# Patient Record
Sex: Male | Born: 1980 | Race: White | Hispanic: No | Marital: Single | Smoking: Never smoker
Health system: Southern US, Community
[De-identification: ages and names within clinical notes are randomized; demographics above are authoritative.]

## PROBLEM LIST (undated history)

## (undated) DIAGNOSIS — E785 Hyperlipidemia, unspecified: Secondary | ICD-10-CM

## (undated) DIAGNOSIS — F32A Depression, unspecified: Secondary | ICD-10-CM

## (undated) HISTORY — DX: Hyperlipidemia, unspecified: E78.5

## (undated) HISTORY — PX: NO PAST SURGERIES: SHX2092

## (undated) HISTORY — DX: Depression, unspecified: F32.A

## (undated) HISTORY — PX: UPPER GASTROINTESTINAL ENDOSCOPY: SHX188

---

## 2009-11-04 DIAGNOSIS — F325 Major depressive disorder, single episode, in full remission: Secondary | ICD-10-CM | POA: Insufficient documentation

## 2020-01-25 DIAGNOSIS — N201 Calculus of ureter: Secondary | ICD-10-CM | POA: Insufficient documentation

## 2021-09-24 ENCOUNTER — Other Ambulatory Visit: Payer: Self-pay | Admitting: Family Medicine

## 2021-09-24 ENCOUNTER — Ambulatory Visit
Admission: RE | Admit: 2021-09-24 | Discharge: 2021-09-24 | Disposition: A | Discharge status: Home / Self Care | Payer: Self-pay | Source: Ambulatory Visit | Attending: Family Medicine | Admitting: Family Medicine

## 2021-09-24 ENCOUNTER — Other Ambulatory Visit: Payer: Self-pay

## 2021-09-24 DIAGNOSIS — R079 Chest pain, unspecified: Secondary | ICD-10-CM

## 2021-11-15 DIAGNOSIS — E7849 Other hyperlipidemia: Secondary | ICD-10-CM | POA: Insufficient documentation

## 2021-11-15 DIAGNOSIS — R0789 Other chest pain: Secondary | ICD-10-CM | POA: Insufficient documentation

## 2022-01-15 ENCOUNTER — Encounter: Payer: Self-pay | Admitting: Physician Assistant

## 2022-01-24 ENCOUNTER — Ambulatory Visit: Payer: BC Managed Care – PPO | Admitting: Physician Assistant

## 2022-01-24 ENCOUNTER — Other Ambulatory Visit: Payer: Self-pay

## 2022-01-24 ENCOUNTER — Encounter: Payer: Self-pay | Admitting: Physician Assistant

## 2022-01-24 VITALS — BP 102/70 | HR 74 | Ht 71.0 in | Wt 161.0 lb

## 2022-01-24 DIAGNOSIS — R0789 Other chest pain: Secondary | ICD-10-CM

## 2022-01-24 NOTE — Patient Instructions (Signed)
You will be contacted by [facility] in the next 2 days to arrange a Barium Swallow and Upper GI  The number on your caller ID will be [phone], please answer when they call.  If you have not heard from them in 2 days please call [phone] to schedule.    ? ? ?You have been scheduled for a Barium Esophogram at [facility] Radiology (1st floor of the hospital) on ________ at ________. Please arrive 15 minutes prior to your appointment for registration. Make certain not to have anything to eat or drink 3 hours prior to your test. If you need to reschedule for any reason, please contact radiology at [phone] to do so. ?__________________________________________________________________ ?A barium swallow is an examination that concentrates on views of the esophagus. This tends to be a double contrast exam (barium and two liquids which, when combined, create a gas to distend the wall of the oesophagus) or single contrast (non-ionic iodine based). The study is usually tailored to your symptoms so a good history is essential. Attention is paid during the study to the form, structure and configuration of the esophagus, looking for functional disorders (such as aspiration, dysphagia, achalasia, motility and reflux) ?EXAMINATION ?You may be asked to change into a gown, depending on the type of swallow being performed. A radiologist and radiographer will perform the procedure. The radiologist will advise you of the ?type of contrast selected for your procedure and direct you during the exam. You will be asked to stand, sit or lie in several different positions and to hold a small amount of fluid in your mouth before being asked to swallow while the imaging is performed .In some instances you may be asked to swallow barium coated marshmallows to assess the motility of a solid food bolus. ?The exam can be recorded as a digital or video fluoroscopy procedure. ?POST PROCEDURE ?It will take 1-2 days for  the barium to pass through your system. To facilitate this, it is important, unless otherwise directed, to increase your fluids for the next 24-48hrs and to resume your normal diet.  ?This test typically takes about 30 minutes to perform. ?__________________________________________________________________________________  ? ?You have been scheduled for an Upper GI Series at ________. Your appointment is on _______ at ________. Please arrive 15 minutes prior to your test for registration. Make sure not to eat or drink anything after midnight on the night before your test. If you need to reschedule, please call radiology at [phone]. ?________________________________________________________________ ?An upper GI series uses x rays to help diagnose problems of the upper GI tract, which includes the esophagus, stomach, and duodenum. The duodenum is the first part of the small intestine. ?An upper GI series is conducted by a radiology technologist or a radiologist--a doctor who specializes in x-ray imaging--at a hospital or outpatient center. ?While sitting or standing in front of an x-ray machine, the patient drinks barium liquid, which is often white and has a chalky consistency and taste. The barium liquid coats the lining of the upper GI tract and makes signs of disease show up more clearly on x rays. X-ray video, called fluoroscopy, is used to view the barium liquid moving through the esophagus, stomach, and duodenum. ?Additional x rays and fluoroscopy are performed while the patient lies on an x-ray table. To fully coat the upper GI tract with barium liquid, the technologist or radiologist may press on the abdomen or ask the patient to change position. Patients hold still in various positions, allowing the  technologist or radiologist to take x rays of the upper GI tract at different angles. If a technologist conducts the upper GI series, a radiologist will later examine the images to look for problems. ? ?This  test typically takes about 1 hour to complete. ?___________________________________________ ? ? ?Due to recent changes in healthcare laws, you may see the results of your imaging and laboratory studies on MyChart before your provider has had a chance to review them.  We understand that in some cases there may be results that are confusing or concerning to you. Not all laboratory results come back in the same time frame and the provider may be waiting for multiple results in order to interpret others.  Please give [place] 48 hours in order for your provider to thoroughly review all the results before contacting the office for clarification of your results.   ? ?If you are age 49 or older, your body mass index should be between 23-30. Your Body mass index is 22.45 kg/m?Marland Kitchen If this is out of the aforementioned range listed, please consider follow up with your Primary Care Provider. ? ?If you are age 46 or younger, your body mass index should be between 19-25. Your Body mass index is 22.45 kg/m?Marland Kitchen If this is out of the aformentioned range listed, please consider follow up with your Primary Care Provider.  ? ?________________________________________________________ ? ?The [place] [facility] providers would like to encourage you to use [facility] to communicate with providers for non-urgent requests or questions.  Due to long hold times on the telephone, sending your provider a message by [facility] may be a faster and more efficient way to get a response.  Please allow 48 business hours for a response.  Please remember that this is for non-urgent requests.  ?_______________________________________________________  ? ?I appreciate the  opportunity to care for you ? ?Thank You  ? ?Jacelyn Grip  ?

## 2022-01-24 NOTE — Progress Notes (Signed)
? ?Chief Complaint: Atypical chest pain ? ?HPI: ?   George Buchanan is a 41 year old male with a past medical history as listed below, who was referred to me by Shawnee Knapp, MD for a complaint of atypical chest pain.   ?   09/25/2021 chest x-ray with no abnormalities. ?   01/03/2022 patient saw PCP and discussed ongoing chest pain for several months with negative cardiology work-up.  Patient has seen cardiology 11/15/2021 and then did a trial of Imdur but it was not tolerated due to headaches.  They discussed doing CTA.  He endorsed some anxiety at his appointment.  He was trying to get off Fluoxetine at that time.  Since then he had increased dose and anxiety was tolerable. ?   Today, the patient presents to clinic and tells me that he has been experiencing some chest pain to the right side of the middle of his chest for the past few months.  This occurs every couple of days and is described as a sharp pain rated as a 2-3/10 which usually lasts a couple of minutes and seems very random with no association with eating or other activities.  Initially described as a "twinge", he tells me he previously had work-up with cardiology who did not feel this was cardiac related.  His PCP thought maybe it was something to do with "my esophagus".  He was tried on Omeprazole 40 mg daily and Carafate 4 times a day for a month and the pain got a little better for a while but then it came back.  Tells me it bothers him that he does not know what is causing it.  Denies any heartburn, reflux or dysphagia. ?   Does tell me his anxiety is under better control since his increased dose of fluoxetine. ?   Denies fever, chills, weight loss or symptoms that awaken him from sleep. ? ?Past medical history: ?Anxiety ? ?Past surgical history: ?None ? ?Current Outpatient Medications  ?Medication Sig Dispense Refill  ? amphetamine-dextroamphetamine (ADDERALL) 5 MG tablet Take 1 tablet by mouth 2 (two) times daily.    ? PARoxetine (PAXIL) 20 MG tablet  Take 20 mg by mouth every morning.    ? rosuvastatin (CRESTOR) 20 MG tablet Take 1 tablet by mouth daily.    ? ?No current facility-administered medications for this visit.  ? ? ?Allergies as of 01/24/2022  ? (Not on File)  ? ? ?Family History  ?Problem Relation Age of Onset  ? Colon cancer Neg Hx   ? Pancreatic cancer Neg Hx   ? Liver cancer Neg Hx   ? Esophageal cancer Neg Hx   ? ? ?Social History  ? ?Socioeconomic History  ? Marital status: Single  ?  Spouse name: Not on file  ? Number of children: Not on file  ? Years of education: Not on file  ? Highest education level: Not on file  ?Occupational History  ? Not on file  ?Tobacco Use  ? Smoking status: Never  ? Smokeless tobacco: Never  ?Substance and Sexual Activity  ? Alcohol use: Not Currently  ? Drug use: Never  ? Sexual activity: Not on file  ?Other Topics Concern  ? Not on file  ?Social History Narrative  ? Not on file  ? ?Social Determinants of Health  ? ?Financial Resource Strain: Not on file  ?Food Insecurity: Not on file  ?Transportation Needs: Not on file  ?Physical Activity: Not on file  ?Stress: Not on file  ?Social  Connections: Not on file  ?Intimate Partner Violence: Not on file  ? ? ?Review of Systems:    ?Constitutional: No weight loss, fever or chills ?Skin: No rash  ?Cardiovascular: No chest pressure or palpitations   ?Respiratory: No SOB  ?Gastrointestinal: See HPI and otherwise negative ?Genitourinary: No dysuria  ?Neurological: No headache, dizziness or syncope ?Musculoskeletal: No new muscle or joint pain ?Hematologic: No bleeding  ?Psychiatric: +anxiety ? ? Physical Exam:  ?Vital signs: ?BP 102/70   Pulse 74   Ht 5\' 11"  (1.803 m)   Wt 161 lb (73 kg)   SpO2 97%   BMI 22.45 kg/m?   ? ?Constitutional:   Pleasant Caucasian male appears to be in NAD, Well developed, Well nourished, alert and cooperative ?Head:  Normocephalic and atraumatic. ?Eyes:   PEERL, EOMI. No icterus. Conjunctiva pink. ?Ears:  Normal auditory acuity. ?Neck:   Supple ?Throat: Oral cavity and pharynx without inflammation, swelling or lesion.  ?Respiratory: Respirations even and unlabored. Lungs clear to auscultation bilaterally.   No wheezes, crackles, or rhonchi.  ?Cardiovascular: Normal S1, S2. No MRG. Regular rate and rhythm. No peripheral edema, cyanosis or pallor.  ?Gastrointestinal:  Soft, nondistended, nontender. No rebound or guarding. Normal bowel sounds. No appreciable masses or hepatomegaly. ?Rectal:  Not performed.  ?Msk:  Symmetrical without gross deformities. Without edema, no deformity or joint abnormality.  ?Neurologic:  Alert and  oriented x4;  grossly normal neurologically.  ?Skin:   Dry and intact without significant lesions or rashes. ?Psychiatric: Demonstrates good judgement and reason without abnormal affect or behaviors. ? ?No recent labs or imaging. ? ?Assessment: ?1.  Atypical chest pain: Cardiac evaluation negative, PCP believes may be something to do with his esophagus, was some better on Omeprazole daily and Carafate 4 times a day for a month, but then symptoms return, denies overt heartburn or reflux or association with food/eating, does have history of anxiety; consider possible functional dyspepsia versus esophageal stricture versus esophagitis versus dysmotility versus other ? ?Plan: ?1.  Discussed possible EGD versus noninvasive testing with the patient.  He would prefer noninvasive testing first.  Scheduled patient for barium esophagram with tablet and upper GI series for evaluation.  Pending results of this may need to further discuss an EGD or retrial of Omeprazole 40 at an increased dose of twice daily for 2 months. ?2.  Patient to follow in clinic per recommendations after above.  He was assigned to Dr. Candis Schatz today. ? ?George Newer, PA-C ?[facility] ?01/24/2022, 11:03 AM ? ?Cc: Shawnee Knapp, MD  ?

## 2022-01-28 NOTE — Progress Notes (Signed)
Agree with the assessment and plan as outlined by Jennifer Lemmon, PA-C. ? ?Valen Mascaro E. Lennyn Gange, MD ? ?

## 2022-02-05 ENCOUNTER — Ambulatory Visit (HOSPITAL_COMMUNITY)
Admission: RE | Admit: 2022-02-05 | Discharge: 2022-02-05 | Disposition: A | Discharge status: Home / Self Care | Payer: BC Managed Care – PPO | Source: Ambulatory Visit | Attending: Physician Assistant | Admitting: Physician Assistant

## 2022-02-05 ENCOUNTER — Other Ambulatory Visit: Payer: Self-pay

## 2022-02-05 DIAGNOSIS — R0789 Other chest pain: Secondary | ICD-10-CM | POA: Insufficient documentation

## 2022-02-05 MED ORDER — OMEPRAZOLE 40 MG PO CPDR: 40.0000 mg | DELAYED_RELEASE_CAPSULE | Freq: Two times a day (BID) | ORAL | 2 refills | Status: AC

## 2022-04-05 ENCOUNTER — Ambulatory Visit: Payer: BC Managed Care – PPO | Admitting: Physician Assistant

## 2022-04-05 ENCOUNTER — Encounter: Payer: Self-pay | Admitting: Physician Assistant

## 2022-04-05 VITALS — BP 124/78 | HR 81 | Ht 71.0 in | Wt 156.6 lb

## 2022-04-05 DIAGNOSIS — R0789 Other chest pain: Secondary | ICD-10-CM | POA: Diagnosis not present

## 2022-04-05 NOTE — Patient Instructions (Signed)
Continue Omeprazole 40 mg twice daily 30-60 minutes before breakfast and dinner.   You have been scheduled for an endoscopy. Please follow written instructions given to you at your visit today. If you use inhalers (even only as needed), please bring them with you on the day of your procedure.  If you are age 43 or older, your body mass index should be between 23-30. Your Body mass index is 21.84 kg/m. If this is out of the aforementioned range listed, please consider follow up with your Primary Care Provider.  If you are age 84 or younger, your body mass index should be between 19-25. Your Body mass index is 21.84 kg/m. If this is out of the aformentioned range listed, please consider follow up with your Primary Care Provider.   ________________________________________________________  The [place] [facility] providers would like to encourage you to use [facility] to communicate with providers for non-urgent requests or questions.  Due to long hold times on the telephone, sending your provider a message by [facility] may be a faster and more efficient way to get a response.  Please allow 48 business hours for a response.  Please remember that this is for non-urgent requests.  _______________________________________________________

## 2022-04-05 NOTE — Progress Notes (Signed)
Chief Complaint: Follow-up atypical chest pain  HPI:     George Buchanan is a 41 year old male, signed to Dr. Candis Schatz, with a past medical history as listed below, who returns to clinic today for follow-up of atypical chest pain.    09/25/2021 chest x-ray with no abnormalities.    01/03/2022 patient saw PCP and discussed ongoing chest pain for several months with negative cardiology work-up.  Patient has seen cardiology 11/15/2021 and then did a trial of Imdur but it was not tolerated due to headaches.  They discussed doing CTA.  He endorsed some anxiety at his appointment.  He was trying to get off Fluoxetine at that time.  Since then he had increased dose and anxiety was tolerable    01/24/2022 patient seen in clinic and describes some chest pain for the past few months rated as a 2-3/10 which lasted for few minutes and was very random and unrelated to eating or other activities.  He had been tried on Omeprazole 40 mg daily and Carafate 4 times daily and the pain got a little better but then came back.  At that time discussed negative cardiac evaluation and possible EGD versus noninvasive testing.  Patient wanted noninvasive testing first and we ordered a barium esophagram and upper GI series.  Discussed possible EGD or retrial of omeprazole 40 mg increased dose to twice daily.    02/05/2022 upper GI was normal.  At that time patient wanted to hold off on an EGD and we started Omeprazole 40 mg twice a day.    Today, the patient tells me he is still having some chest pain at times which has not changed at all but is happening maybe slightly less frequently since using the Omeprazole 40 twice a day.  Does express that he thinks it may be related to anxiety    Denies fever, chills, weight loss or change in bowel habits.  Past medical /surgical history: Anxiety  Current Outpatient Medications  Medication Sig Dispense Refill   amphetamine-dextroamphetamine (ADDERALL) 5 MG tablet Take 1 tablet by mouth 2  (two) times daily.     omeprazole (PRILOSEC) 40 MG capsule Take 1 capsule (40 mg total) by mouth 2 (two) times daily before a meal. 60 capsule 2   PARoxetine (PAXIL) 20 MG tablet Take 20 mg by mouth every morning.     rosuvastatin (CRESTOR) 20 MG tablet Take 1 tablet by mouth daily.     No current facility-administered medications for this visit.    Allergies as of 04/05/2022   (Not on File)    Family History  Problem Relation Age of Onset   Colon cancer Neg Hx    Pancreatic cancer Neg Hx    Liver cancer Neg Hx    Esophageal cancer Neg Hx     Social History   Socioeconomic History   Marital status: Single    Spouse name: Not on file   Number of children: Not on file   Years of education: Not on file   Highest education level: Not on file  Occupational History   Not on file  Tobacco Use   Smoking status: Never   Smokeless tobacco: Never  Substance and Sexual Activity   Alcohol use: Not Currently   Drug use: Never   Sexual activity: Not on file  Other Topics Concern   Not on file  Social History Narrative   Not on file   Social Determinants of Health   Financial Resource Strain: Not on file  Food Insecurity: Not on file  Transportation Needs: Not on file  Physical Activity: Not on file  Stress: Not on file  Social Connections: Not on file  Intimate Partner Violence: Not on file    Review of Systems:    Constitutional: No weight loss, fever or chills Cardiovascular: No palpitations Respiratory: No SOB  Gastrointestinal: See HPI and otherwise negative   Physical Exam:  Vital signs: BP 124/78   Pulse 81   Ht 5\' 11"  (1.803 m)   Wt 156 lb 9.6 oz (71 kg)   SpO2 97%   BMI 21.84 kg/m    Constitutional:   Pleasant Caucasian male appears to be in NAD, Well developed, Well nourished, alert and cooperative Respiratory: Respirations even and unlabored. Lungs clear to auscultation bilaterally.   No wheezes, crackles, or rhonchi.  Cardiovascular: Normal S1, S2.  No MRG. Regular rate and rhythm. No peripheral edema, cyanosis or pallor.  Gastrointestinal:  Soft, nondistended, nontender. No rebound or guarding. Normal bowel sounds. No appreciable masses or hepatomegaly. Psychiatric: Oriented to person, place and time. Demonstrates good judgement and reason without abnormal affect or behaviors.  No recent labs or imaging.  Assessment: 1.  Atypical chest pain: Previous cardiac work-up negative, upper GI negative, minimal improvement on Omeprazole 40 mg twice a day, history of anxiety; consider gastritis +/- H. pylori versus more likely functional dyspepsia  Plan: 1.  Scheduled patient for diagnostic EGD in the [place] with Dr. Candis Schatz.  Did provide the patient a detailed list risks for the procedure and he agrees to proceed. Patient is appropriate for endoscopic procedure(s) in the ambulatory ([place]) setting.  2.  Continue Omeprazole 40 mg twice a day for now.  May need to adjust pending results from above. 3.  Patient to return to clinic per recommendations from Dr. Candis Schatz after time of procedure.  Ellouise Newer, PA-C [facility] 04/05/2022, 9:47 AM  Cc: No ref. provider found

## 2022-04-08 NOTE — Progress Notes (Signed)
Agree with the assessment and plan as outlined by Jennifer Lemmon, PA-C. ? ?Burle Kwan E. Kameryn Davern, MD ? ?

## 2022-05-03 ENCOUNTER — Encounter: Payer: BC Managed Care – PPO | Admitting: Gastroenterology

## 2022-05-14 ENCOUNTER — Encounter: Payer: Self-pay | Admitting: Gastroenterology

## 2022-05-14 ENCOUNTER — Ambulatory Visit (AMBULATORY_SURGERY_CENTER): Payer: BC Managed Care – PPO | Admitting: Gastroenterology

## 2022-05-14 VITALS — BP 102/71 | HR 77 | Temp 98.0°F | Resp 14 | Ht 71.0 in | Wt 156.0 lb

## 2022-05-14 DIAGNOSIS — R0789 Other chest pain: Secondary | ICD-10-CM | POA: Diagnosis not present

## 2022-05-14 MED ORDER — SODIUM CHLORIDE 0.9 % IV SOLN: 500.0000 mL | Freq: Once | INTRAVENOUS | Status: DC

## 2022-05-14 NOTE — Progress Notes (Signed)
Sedate, gd SR, tolerated procedure well, VSS, report to RN 

## 2022-05-14 NOTE — Progress Notes (Signed)
[facility] History and Physical   Primary Care Physician:  Pcp, No   Reason for Procedure:   Atypical chest pain  Plan:    EGD     HPI: George Buchanan is a 41 y.o. male undergoing EGD to evaluate chronic atypical chest pain, partially improved with PPI.  No other typical GERD symptoms.  Negative cardiac evaluation.   Past Medical History:  Diagnosis Date   Depression    Hyperlipidemia     Past Surgical History:  Procedure Laterality Date   NO PAST SURGERIES     UPPER GASTROINTESTINAL ENDOSCOPY      Prior to Admission medications   Medication Sig Start Date End Date Taking? Authorizing Provider  estradiol valerate (DELESTROGEN) 20 MG/ML injection  11/10/20  Yes [provider]  omeprazole (PRILOSEC) 40 MG capsule Take 1 capsule (40 mg total) by mouth 2 (two) times daily before a meal. 02/05/22  Yes Lemmon, Violet Baldy, PA  PARoxetine (PAXIL) 20 MG tablet Take 20 mg by mouth every morning. 12/24/21  Yes [provider]  rosuvastatin (CRESTOR) 20 MG tablet Take 1 tablet by mouth daily. 01/10/22  Yes [provider]  LORazepam (ATIVAN) 0.5 MG tablet Take 0.5 mg by mouth 2 (two) times daily as needed. 03/07/22   [provider]    Current Outpatient Medications  Medication Sig Dispense Refill   estradiol valerate (DELESTROGEN) 20 MG/ML injection      omeprazole (PRILOSEC) 40 MG capsule Take 1 capsule (40 mg total) by mouth 2 (two) times daily before a meal. 60 capsule 2   PARoxetine (PAXIL) 20 MG tablet Take 20 mg by mouth every morning.     rosuvastatin (CRESTOR) 20 MG tablet Take 1 tablet by mouth daily.     LORazepam (ATIVAN) 0.5 MG tablet Take 0.5 mg by mouth 2 (two) times daily as needed.     Current Facility-Administered Medications  Medication Dose Route Frequency Provider Last Rate Last Admin   0.9 %  sodium chloride infusion  500 mL Intravenous Once Jenel Lucks, MD        Allergies as of 05/14/2022   (Not on  File)    Family History  Problem Relation Age of Onset   Colon cancer Neg Hx    Pancreatic cancer Neg Hx    Liver cancer Neg Hx    Esophageal cancer Neg Hx    Rectal cancer Neg Hx    Stomach cancer Neg Hx     Social History   Socioeconomic History   Marital status: Single    Spouse name: Not on file   Number of children: Not on file   Years of education: Not on file   Highest education level: Not on file  Occupational History   Not on file  Tobacco Use   Smoking status: Never   Smokeless tobacco: Never  Vaping Use   Vaping Use: Never used  Substance and Sexual Activity   Alcohol use: Not Currently   Drug use: Never   Sexual activity: Not on file  Other Topics Concern   Not on file  Social History Narrative   Not on file   Social Determinants of Health   Financial Resource Strain: Not on file  Food Insecurity: Not on file  Transportation Needs: Not on file  Physical Activity: Not on file  Stress: Not on file  Social Connections: Not on file  Intimate Partner Violence: Not on file    Review of Systems:  All other review  of systems negative except as mentioned in the HPI.  Physical Exam: Vital signs BP 115/76   Pulse 81   Temp 98 F (36.7 C) (Temporal)   Ht 5\' 11"  (1.803 m)   Wt 156 lb (70.8 kg)   SpO2 99%   BMI 21.76 kg/m   General:   Alert,  Well-developed, well-nourished, pleasant and cooperative in NAD Airway:  Mallampati 1 Lungs:  Clear throughout to auscultation.   Heart:  Regular rate and rhythm; no murmurs, clicks, rubs,  or gallops. Abdomen:  Soft, nontender and nondistended. Normal bowel sounds.   Neuro/Psych:  Normal mood and affect. A and O x 3   Antionetta Ator E. , MD [facility]

## 2022-05-14 NOTE — Op Note (Signed)
[facility] Patient Name: George Buchanan Procedure Date: 05/14/2022 9:59 AM MRN: [id number] Endoscopist: Lorin Picket E. Tomasa Rand , MD Age: 41 Referring MD:  Date of Birth: 05/26/1981 Gender: Male Account #: [id number] Procedure:                Upper GI endoscopy Indications:              Chest pain (non cardiac) Medicines:                Monitored Anesthesia Care Procedure:                Pre-Anesthesia Assessment:                           - Prior to the procedure, a History and Physical                            was performed, and patient medications and                            allergies were reviewed. The patient's tolerance of                            previous anesthesia was also reviewed. The risks                            and benefits of the procedure and the sedation                            options and risks were discussed with the patient.                            All questions were answered, and informed consent                            was obtained. Prior Anticoagulants: The patient has                            taken no previous anticoagulant or antiplatelet                            agents. ASA Grade Assessment: II - A patient with                            mild systemic disease. After reviewing the risks                            and benefits, the patient was deemed in                            satisfactory condition to undergo the procedure.                           After obtaining informed consent, the endoscope was  passed under direct vision. Throughout the                            procedure, the patient's blood pressure, pulse, and                            oxygen saturations were monitored continuously. The                            Endoscope was introduced through the mouth, and                            advanced to the third part of duodenum. The upper                            GI endoscopy was accomplished  without difficulty.                            The patient tolerated the procedure well. Scope In: Scope Out: Findings:                 The examined portions of the nasopharynx,                            oropharynx and larynx were normal.                           The examined esophagus was normal.                           The entire examined stomach was normal.                           The examined duodenum was normal. Complications:            No immediate complications. Estimated Blood Loss:     Estimated blood loss: none. Impression:               - The examined portions of the nasopharynx,                            oropharynx and larynx were normal.                           - Normal esophagus.                           - Normal stomach.                           - Normal examined duodenum.                           - No specimens collected. Recommendation:           - Patient has a contact number available for  emergencies. The signs and symptoms of potential                            delayed complications were discussed with the                            patient. Return to normal activities tomorrow.                            Written discharge instructions were provided to the                            patient.                           - Resume previous diet.                           - Continue present medications.                           - Although chest pain unlikely to be esophageal,                            pH/impedance testing and esophageal manometry could                            be considered for further evaluation.                           - Okay to discontinue omeprazole if not helping                            with symptoms. Danity Schmelzer E. Tomasa Rand, MD 05/14/2022 10:22:09 AM This report has been signed electronically.

## 2022-05-14 NOTE — Patient Instructions (Addendum)
Okay to discontinue omeprazole if not helping with symptoms.  YOU HAD AN ENDOSCOPIC PROCEDURE TODAY AT THE [facility]:   Refer to the procedure report that was given to you for any specific questions about what was found during the examination.  If the procedure report does not answer your questions, please call your gastroenterologist to clarify.  If you requested that your care partner not be given the details of your procedure findings, then the procedure report has been included in a sealed envelope for you to review at your convenience later.  YOU SHOULD EXPECT: Some feelings of bloating in the abdomen. Passage of more gas than usual.  Walking can help get rid of the air that was put into your GI tract during the procedure and reduce the bloating. If you had a lower endoscopy (such as a colonoscopy or flexible sigmoidoscopy) you may notice spotting of blood in your stool or on the toilet paper. If you underwent a bowel prep for your procedure, you may not have a normal bowel movement for a few days.  Please Note:  You might notice some irritation and congestion in your nose or some drainage.  This is from the oxygen used during your procedure.  There is no need for concern and it should clear up in a day or so.  SYMPTOMS TO REPORT IMMEDIATELY:  Following upper endoscopy (EGD)  Vomiting of blood or coffee ground material  New chest pain or pain under the shoulder blades  Painful or persistently difficult swallowing  New shortness of breath  Fever of 100F or higher  Black, tarry-looking stools  For urgent or emergent issues, a gastroenterologist can be reached at any hour by calling [phone]. Do not use MyChart messaging for urgent concerns.    DIET:  We do recommend a small meal at first, but then you may proceed to your regular diet.  Drink plenty of fluids but you should avoid alcoholic beverages for 24 hours.  ACTIVITY:  You should plan to take it easy for the rest  of today and you should NOT DRIVE or use heavy machinery until tomorrow (because of the sedation medicines used during the test).    FOLLOW UP: Our staff will call the number listed on your records the next business day following your procedure.  We will call around 7:15- 8:00 am to check on you and address any questions or concerns that you may have regarding the information given to you following your procedure. If we do not reach you, we will leave a message.  If you develop any symptoms (ie: fever, flu-like symptoms, shortness of breath, cough etc.) before then, please call [phone].  If you test positive for Covid 19 in the 2 weeks post procedure, please call and report this information to [place].    If any biopsies were taken you will be contacted by phone or by letter within the next 1-3 weeks.  Please call us at [phone] if you have not heard about the biopsies in 3 weeks.    SIGNATURES/CONFIDENTIALITY: You and/or your care partner have signed paperwork which will be entered into your electronic medical record.  These signatures attest to the fact that that the information above on your After Visit Summary has been reviewed and is understood.  Full responsibility of the confidentiality of this discharge information lies with you and/or your care-partner.

## 2022-05-15 ENCOUNTER — Telehealth: Payer: Self-pay

## 2022-05-15 NOTE — Telephone Encounter (Signed)
  Follow up Call-     05/14/2022    9:18 AM  Call back number  Post procedure Call Back phone  # [phone]  Permission to leave phone message Yes     Left message

## 2024-02-16 IMAGING — DX DG UGI W/ HIGH DENSITY W/O KUB
12 of 20 series · 12 of 24 positions shown · non-contrast
Comparison: NONE.

CLINICAL DATA: Patient referred for upper GI double contrast study
for ongoing atypical chest pain with a negative cardiac workup.

EXAM:
UPPER GI SERIES WITH HIGH DENSITY WITHOUT KUB; ESOPHAGUS/BARIUM
SWALLOW/TABLET STUDY
TECHNIQUE: Scout radiograph was obtained. Combined double and single contrast
examination was performed using effervescent crystals, high-density
barium and thin liquid barium. This exam was performed by Jumper
Isanova, NP, and was supervised and interpreted by Larma, Md Sohel Khan.
FLUOROSCOPY:
Radiation Exposure Index (as provided by the fluoroscopic device):
45.8 mGy

[cp_standard (1 of 12) · tomo slice 35/69.0]
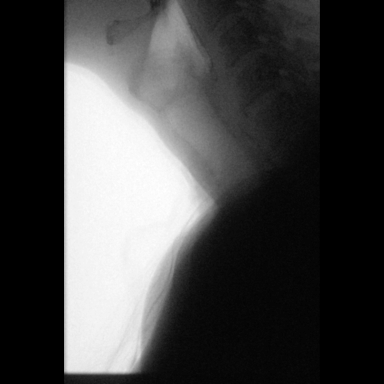

[cp_standard (2 of 12) · tomo slice 25/163.0]
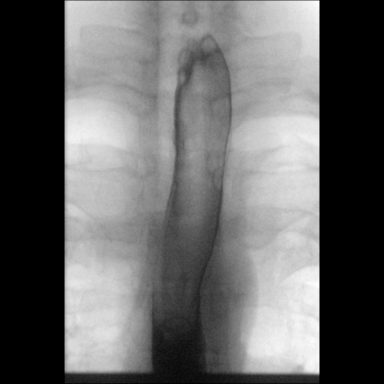

[cp_standard (3 of 12) · tomo slice 229/269.0]
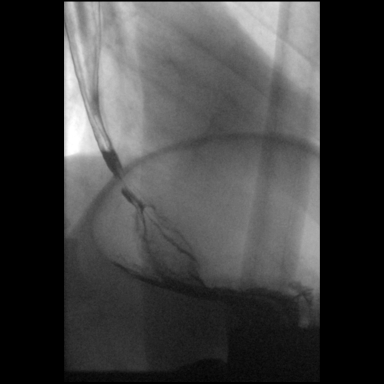

[cp_standard (4 of 12) · tomo slice 20/48.0]
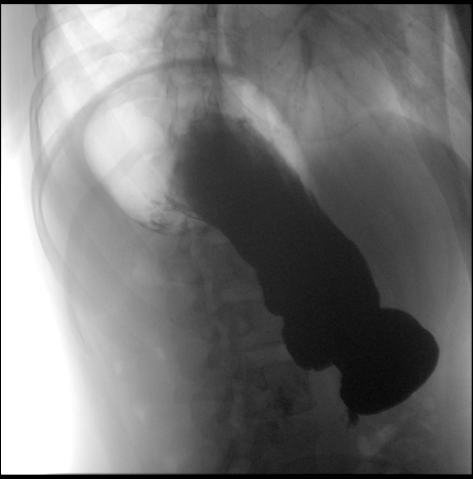

[cp_standard (5 of 12) · tomo slice 6/37.0]
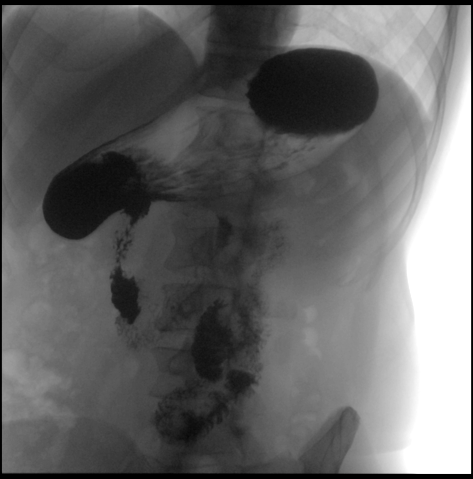

[cp_standard (6 of 12) · tomo slice 36/42.0]
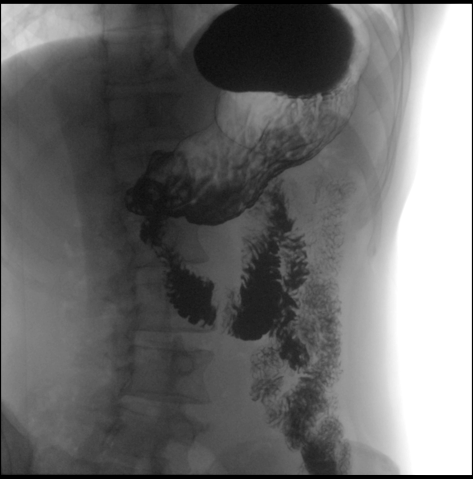

[cp_standard (7 of 12) · tomo slice 109/132.0]
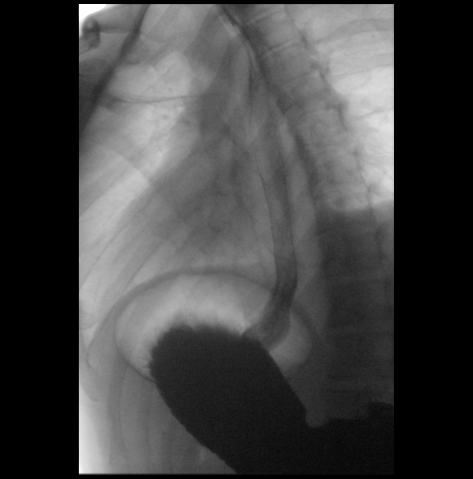

[cp_standard (8 of 12) · tomo slice 1/39.0]
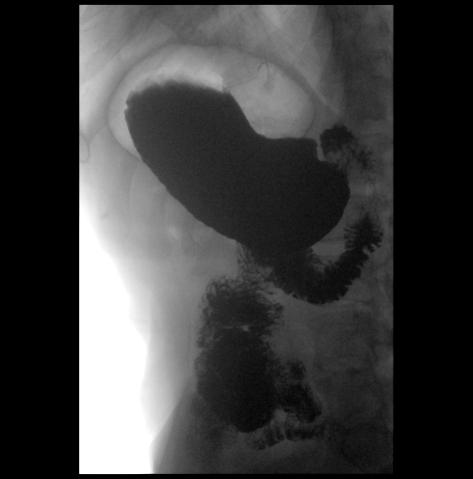

[cp_standard (9 of 12) · tomo slice 47/55.0]
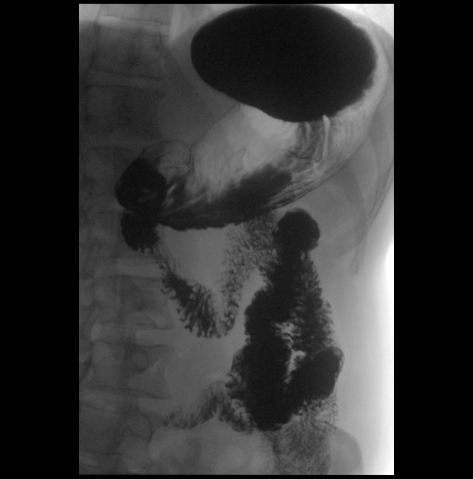

[cp_standard (10 of 12) · tomo slice 46/58.0]
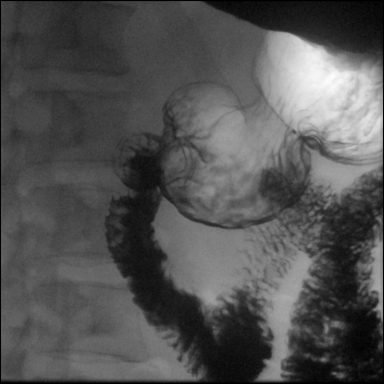

[cp_standard (11 of 12) · tomo slice 13/82.0]
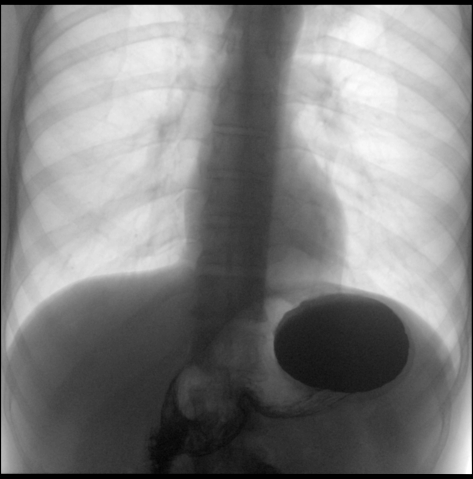

[cp_standard (12 of 12) · tomo slice 58/68.0]
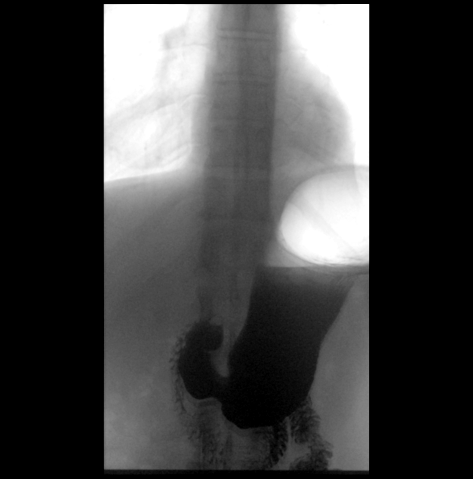

[12 of 24 positions shown; findings below may reference images not displayed]

FINDINGS: Scout Radiograph:  Normal

Esophagus: Normal appearance with no esophageal mass or stricture.

Esophageal motility: Within normal limits.

Gastroesophageal reflux: None visualized.

Ingested 13mm barium tablet: Passed normally.

Stomach: Normal appearance. No hiatal hernia.

Gastric emptying: Normal.

Duodenum: Normal appearance.

Other:  None
IMPRESSION: Normal upper GI study.
